# Patient Record
Sex: Male | Born: 1955 | Race: White | Hispanic: No | Marital: Single | Smoking: Never smoker
Health system: Southern US, Community
[De-identification: ages and names within clinical notes are randomized; demographics above are authoritative.]

## PROBLEM LIST (undated history)

## (undated) DIAGNOSIS — I10 Essential (primary) hypertension: Secondary | ICD-10-CM

## (undated) HISTORY — PX: ELBOW SURGERY: SHX618

---

## 2016-06-09 DIAGNOSIS — I10 Essential (primary) hypertension: Secondary | ICD-10-CM | POA: Diagnosis not present

## 2016-06-09 DIAGNOSIS — M79672 Pain in left foot: Secondary | ICD-10-CM | POA: Diagnosis not present

## 2017-02-13 DIAGNOSIS — E7800 Pure hypercholesterolemia, unspecified: Secondary | ICD-10-CM | POA: Diagnosis not present

## 2017-02-13 DIAGNOSIS — I10 Essential (primary) hypertension: Secondary | ICD-10-CM | POA: Diagnosis not present

## 2017-11-02 DIAGNOSIS — I10 Essential (primary) hypertension: Secondary | ICD-10-CM | POA: Diagnosis not present

## 2017-11-02 DIAGNOSIS — R7301 Impaired fasting glucose: Secondary | ICD-10-CM | POA: Diagnosis not present

## 2017-11-02 DIAGNOSIS — E7800 Pure hypercholesterolemia, unspecified: Secondary | ICD-10-CM | POA: Diagnosis not present

## 2017-11-02 DIAGNOSIS — J309 Allergic rhinitis, unspecified: Secondary | ICD-10-CM | POA: Diagnosis not present

## 2018-05-14 DIAGNOSIS — R7303 Prediabetes: Secondary | ICD-10-CM | POA: Diagnosis not present

## 2018-05-14 DIAGNOSIS — E7800 Pure hypercholesterolemia, unspecified: Secondary | ICD-10-CM | POA: Diagnosis not present

## 2018-05-14 DIAGNOSIS — I10 Essential (primary) hypertension: Secondary | ICD-10-CM | POA: Diagnosis not present

## 2018-07-19 DIAGNOSIS — R1900 Intra-abdominal and pelvic swelling, mass and lump, unspecified site: Secondary | ICD-10-CM | POA: Diagnosis not present

## 2018-07-19 DIAGNOSIS — M79671 Pain in right foot: Secondary | ICD-10-CM | POA: Diagnosis not present

## 2018-07-19 DIAGNOSIS — L723 Sebaceous cyst: Secondary | ICD-10-CM | POA: Diagnosis not present

## 2018-07-25 ENCOUNTER — Other Ambulatory Visit: Payer: Self-pay | Admitting: Family Medicine

## 2018-07-25 DIAGNOSIS — R1904 Left lower quadrant abdominal swelling, mass and lump: Secondary | ICD-10-CM

## 2018-07-31 ENCOUNTER — Other Ambulatory Visit: Payer: Self-pay | Admitting: Family Medicine

## 2018-07-31 DIAGNOSIS — R1900 Intra-abdominal and pelvic swelling, mass and lump, unspecified site: Secondary | ICD-10-CM

## 2018-08-01 ENCOUNTER — Other Ambulatory Visit: Payer: Self-pay

## 2018-08-01 ENCOUNTER — Ambulatory Visit
Admission: RE | Admit: 2018-08-01 | Discharge: 2018-08-01 | Disposition: A | Discharge status: Home / Self Care | Payer: 59 | Source: Ambulatory Visit | Attending: Family Medicine | Admitting: Family Medicine

## 2018-08-01 DIAGNOSIS — R1900 Intra-abdominal and pelvic swelling, mass and lump, unspecified site: Secondary | ICD-10-CM

## 2018-08-06 ENCOUNTER — Other Ambulatory Visit: Payer: Self-pay | Admitting: Family Medicine

## 2018-08-06 DIAGNOSIS — R1905 Periumbilic swelling, mass or lump: Secondary | ICD-10-CM

## 2018-08-30 ENCOUNTER — Ambulatory Visit
Admission: RE | Admit: 2018-08-30 | Discharge: 2018-08-30 | Disposition: A | Discharge status: Home / Self Care | Payer: 59 | Source: Ambulatory Visit | Attending: Family Medicine | Admitting: Family Medicine

## 2018-08-30 ENCOUNTER — Other Ambulatory Visit: Payer: Self-pay

## 2018-08-30 DIAGNOSIS — R1905 Periumbilic swelling, mass or lump: Secondary | ICD-10-CM | POA: Diagnosis not present

## 2019-06-23 ENCOUNTER — Encounter: Payer: Self-pay | Admitting: Surgery

## 2019-06-23 ENCOUNTER — Ambulatory Visit: Payer: Self-pay | Admitting: Surgery

## 2019-06-23 DIAGNOSIS — E66813 Obesity, class 3: Secondary | ICD-10-CM | POA: Insufficient documentation

## 2019-06-23 DIAGNOSIS — D128 Benign neoplasm of rectum: Secondary | ICD-10-CM | POA: Insufficient documentation

## 2019-06-23 DIAGNOSIS — K420 Umbilical hernia with obstruction, without gangrene: Secondary | ICD-10-CM | POA: Insufficient documentation

## 2019-06-23 DIAGNOSIS — I10 Essential (primary) hypertension: Secondary | ICD-10-CM

## 2019-06-23 DIAGNOSIS — R221 Localized swelling, mass and lump, neck: Secondary | ICD-10-CM
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
## 2019-07-17 ENCOUNTER — Other Ambulatory Visit: Payer: Self-pay

## 2019-07-17 ENCOUNTER — Encounter (HOSPITAL_COMMUNITY): Payer: Self-pay

## 2019-07-17 ENCOUNTER — Encounter (HOSPITAL_COMMUNITY)
Admission: RE | Admit: 2019-07-17 | Discharge: 2019-07-17 | Disposition: A | Discharge status: Home / Self Care | Payer: 59 | Source: Ambulatory Visit | Attending: Surgery | Admitting: Surgery

## 2019-07-17 DIAGNOSIS — Z01818 Encounter for other preprocedural examination: Secondary | ICD-10-CM | POA: Insufficient documentation

## 2019-07-17 HISTORY — DX: Essential (primary) hypertension: I10

## 2019-07-17 LAB — CBC
HCT: 45.1 % (ref 39.0–52.0)
Hemoglobin: 14.7 g/dL (ref 13.0–17.0)
MCH: 30.3 pg (ref 26.0–34.0)
MCHC: 32.6 g/dL (ref 30.0–36.0)
MCV: 93 fL (ref 80.0–100.0)
Platelets: 295 10*3/uL (ref 150–400)
RBC: 4.85 MIL/uL (ref 4.22–5.81)
RDW: 13 % (ref 11.5–15.5)
WBC: 8.6 10*3/uL (ref 4.0–10.5)
nRBC: 0 % (ref 0.0–0.2)

## 2019-07-17 LAB — BASIC METABOLIC PANEL
Anion gap: 12 (ref 5–15)
BUN: 22 mg/dL (ref 8–23)
CO2: 23 mmol/L (ref 22–32)
Calcium: 8.9 mg/dL (ref 8.9–10.3)
Chloride: 103 mmol/L (ref 98–111)
Creatinine, Ser: 1.01 mg/dL (ref 0.61–1.24)
GFR calc Af Amer: >60 mL/min (ref >60)
GFR calc non Af Amer: >60 mL/min (ref >60)
Glucose, Bld: 101 mg/dL — ABNORMAL HIGH (ref 70–99)
Potassium: 3.8 mmol/L (ref 3.5–5.1)
Sodium: 138 mmol/L (ref 135–145)

## 2019-07-17 LAB — HEMOGLOBIN A1C
Hgb A1c MFr Bld: 6 % — ABNORMAL HIGH (ref 4.8–5.6)
Mean Plasma Glucose: 125.5 mg/dL

## 2019-07-17 NOTE — Patient Instructions (Addendum)
DUE TO COVID-19 ONLY ONE VISITOR IS ALLOWED TO COME WITH YOU AND [name] IN THE WAITING ROOM ONLY DURING PRE OP AND PROCEDURE DAY OF SURGERY. THE 1 VISITOR MAY VISIT WITH YOU AFTER SURGERY IN YOUR PRIVATE ROOM DURING VISITING HOURS ONLY!  YOU NEED TO HAVE A COVID 19 TEST ON_Monday 03/08/2021______ @__9 :30 am_____, THIS TEST MUST BE DONE BEFORE SURGERY, COME  [name] , [id number].  ([place]) ONCE YOUR COVID TEST IS COMPLETED, PLEASE BEGIN THE QUARANTINE INSTRUCTIONS AS OUTLINED IN YOUR HANDOUT.                [name]     Your procedure is scheduled on: Thursday 07/24/2019   Report to [facility]  [name]    Report to admitting at 0800 AM     Call this number Mark [name] you have problems the morning of surgery [phone]               Please follow bowel prep instructions from Dr. Johney [place] the day before surgery on Wednesday 07/23/2019 and follow a clear liquid diet!    DRINK 2 PRESURGERY ENSURE DRINKS THE NIGHT BEFORE SURGERY AT1000 PM AND 1 PRESURGERY DRINK THE DAY OF THE PROCEDURE 3 HOURS PRIOR TO SCHEDULED SURGERY.    NO SOLIDS AFTER MIDNIGHT THE DAY PRIOR TO THE SURGERY.    NOTHING BY MOUTH EXCEPT CLEAR LIQUIDS UNTIL THREE HOURS PRIOR TO SCHEDULED SURGERY.    PLEASE FINISH PRESURGERY ENSURE DRINK PER SURGEON ORDER 3 HOURS PRIOR TO SCHEDULED SURGERY TIME WHICH NEEDS TO BE COMPLETED AT 0700 am!    CLEAR LIQUID DIET   Foods Allowed                                                                     Foods Excluded  Coffee and tea, regular and decaf                             liquids that you cannot  Plain Jell-O any favor except red or purple                                           see through such as: Fruit ices (not with fruit pulp)                                     milk, soups, orange juice  Iced Popsicles                                    All solid food Carbonated beverages, regular and diet                                     Cranberry, grape and apple juices Sports drinks like Gatorade Lightly seasoned clear broth or consume(fat free) Sugar, honey syrup  Sample Menu  Breakfast                                Lunch                                     Supper Cranberry juice                    Beef broth                            Chicken broth Jell-O                                     Grape juice                           Apple juice Coffee or tea                        Jell-O                                      Popsicle                                                Coffee or tea                        Coffee or tea  _____________________________________________________________________           BRUSH YOUR TEETH MORNING OF SURGERY AND RINSE YOUR MOUTH OUT, NO CHEWING GUM CANDY OR MINTS.     Take these medicines the morning of surgery with A SIP OF WATER: Doxazosin (Cardura), Metoprolol (Toprol-XL)                                 You may not have any metal on your body including hair pins and              piercings  Do not wear jewelry, make-up, lotions, powders or perfumes, deodorant                          Men may shave face and neck.   Do not bring valuables to the hospital. [place].  Contacts, dentures or bridgework may not be worn into surgery.  Leave suitcase in the car. After surgery it may be brought to your room.     Patients discharged the day of surgery will not be allowed to drive home. Mark [name] YOU ARE HAVING SURGERY AND GOING HOME THE SAME DAY, YOU MUST HAVE AN ADULT TO DRIVE YOU HOME AND  BE WITH YOU FOR 24 HOURS. YOU MAY GO HOME BY TAXI OR UBER OR ORTHERWISE, BUT AN ADULT MUST ACCOMPANY YOU HOME AND [name]  WITH YOU FOR 24 HOURS.  Name and phone number of your driver:daughter-Mark Mark [name]                Please read over the following fact sheets you were given: _____________________________________________________________________              [name] - Preparing for Surgery Before surgery, you can play an important role.  Because skin is not sterile, your skin needs to be as free of germs as possible.  You can reduce the number of germs on your skin by washing with CHG (chlorahexidine gluconate) soap before surgery.  CHG is an antiseptic cleaner which kills germs and bonds with the skin to continue killing germs even after washing. Please DO NOT use Mark [name]  Mark [name] your skin becomes reddened/irritated stop using the CHG and inform your nurse when you arrive at [name]. Do not shave (including legs and underarms) for at least 48 hours prior to the first CHG shower.  You may shave your face/neck. Please follow these instructions carefully:  1.  Shower with CHG Soap the night before surgery and the  morning of Surgery.  2.  Mark [name] you choose to wash your hair, wash your hair first as usual with your  normal  shampoo.  3.  After you shampoo, rinse your hair and body thoroughly to remove the  shampoo.                           4.  Use CHG as you would any other liquid soap.  You can apply chg directly  to the skin and wash                       Gently with a scrungie or clean washcloth.  5.  Apply the CHG Soap to your body ONLY FROM THE NECK DOWN.   Do not use on face/ open                           Wound or open sores. Avoid contact with eyes, ears mouth and genitals (private parts).                       Wash face,  Genitals (private parts) with your normal soap.             6.  Wash thoroughly, paying special attention to the area where your surgery  will be performed.  7.  Thoroughly rinse your body with warm water from the neck down.  8.  DO NOT shower/wash with your normal soap after using and rinsing off  the CHG Soap.                9.  Pat yourself dry with a clean towel.            10.  Wear clean pajamas.            11.  Place clean sheets on your bed the night of your first shower  and do not  sleep with pets. Day of Surgery : Do not apply any lotions/deodorants the morning of surgery.  Please wear clean clothes to the hospital/surgery center.  FAILURE TO FOLLOW THESE INSTRUCTIONS MAY RESULT IN THE CANCELLATION OF YOUR SURGERY PATIENT SIGNATURE_________________________________  NURSE SIGNATURE__________________________________  ________________________________________________________________________   [name]  An  incentive spirometer is a tool that can help keep your lungs clear and active. This tool measures how well you are filling your lungs with each breath. Taking long deep breaths may help reverse or decrease the chance of developing breathing (pulmonary) problems (especially infection) following:  A long period of time when you are unable to move or be active. BEFORE THE PROCEDURE   Mark [name] the spirometer includes an indicator to show your best effort, your nurse or respiratory therapist will set it to a desired goal.  Mark [name] possible, sit up straight or lean slightly forward. Try not to slouch.  Hold the incentive spirometer in an upright position. INSTRUCTIONS FOR USE  1. Sit on the edge of your bed Mark [name] possible, or sit up as far as you can in bed or on a chair. 2. Hold the incentive spirometer in an upright position. 3. Breathe out normally. 4. Place the mouthpiece in your mouth and seal your lips tightly around it. 5. Breathe in slowly and as deeply as possible, raising the piston or the ball toward the top of the column. 6. Hold your breath for 3-5 seconds or for as long as possible. Allow the piston or ball to fall to the bottom of the column. 7. Remove the mouthpiece from your mouth and breathe out normally. 8. Rest for a few seconds and repeat Steps 1 through 7 at least 10 times every 1-2 hours when you are awake. Take your time and take a few normal breaths between deep breaths. 9. The spirometer may include an indicator to show your best  effort. Use the indicator as a goal to work toward during each repetition. 10. After each set of 10 deep breaths, practice coughing to be sure your lungs are clear. Mark [name] you have an incision (the cut made at the time of surgery), support your incision when coughing by placing a pillow or rolled up towels firmly against it. Once you are able to get out of bed, walk around indoors and cough well. You may stop using the incentive spirometer when instructed by your caregiver.  RISKS AND COMPLICATIONS  Take your time so you do not get dizzy or light-headed.  Mark [name] you are in pain, you may need to take or ask for pain medication before doing incentive spirometry. It is harder to take a deep breath Mark [name] you are having pain. AFTER USE  Rest and breathe slowly and easily.  It can be helpful to keep track of a log of your progress. Your caregiver can provide you with a simple table to help with this. Mark [name] you are using the spirometer at home, follow these instructions: Mark Mark [name]:   You are having difficultly using the spirometer.  You have trouble using the spirometer as often as instructed.  Your pain medication is not giving enough relief while using the spirometer.  You develop fever of 100.5 F (38.1 C) or higher. SEEK IMMEDIATE MEDICAL CARE Mark [name]:   You cough up bloody sputum that had not been present before.  You develop fever of 102 F (38.9 C) or greater.  You develop worsening pain at or near the incision site. MAKE SURE YOU:   Understand these instructions.  Will watch your condition.  Will get help right away Mark [name] you are not doing well or get worse. Document Released: 09/11/2006 Document Revised: 07/24/2011 Document Reviewed: 11/12/2006 [facility] Patient Information 2014 [place], [place].   ________________________________________________________________________

## 2019-07-17 NOTE — Progress Notes (Signed)
PCP - Dr. Donald Prose Cardiologist - n/a  Chest x-ray - n/a EKG - 07/17/2019  epic Stress Test - n/a ECHO - n/a Cardiac Cath - n/a  Sleep Study - n/a CPAP - n/a  Fasting Blood Sugar -n/a Checks Blood Sugar _0____ times a day  Blood Thinner Instructions:n/a Aspirin Instructions:takes Aspirin EC 81 mg per Dr. Nancy Fetter. Instructed to call Dr. Nancy Fetter and get instructions for stopping Aspirin prior to surgery. Patient verbalized understanding. Last Dose:07/17/2019  Anesthesia review:   Patient has a history of HTN.  Patient denies shortness of breath, fever, cough and chest pain at PAT appointment   Patient verbalized understanding of instructions that were given to them at the PAT appointment. Patient was also instructed that they will need to review over the PAT instructions again at home before surgery.

## 2019-07-21 ENCOUNTER — Other Ambulatory Visit (HOSPITAL_COMMUNITY)
Admission: RE | Admit: 2019-07-21 | Discharge: 2019-07-21 | Disposition: A | Discharge status: Home / Self Care | Payer: 59 | Source: Ambulatory Visit | Attending: Surgery | Admitting: Surgery

## 2019-07-21 DIAGNOSIS — Z20822 Contact with and (suspected) exposure to covid-19: Secondary | ICD-10-CM | POA: Diagnosis not present

## 2019-07-21 DIAGNOSIS — Z01812 Encounter for preprocedural laboratory examination: Secondary | ICD-10-CM | POA: Diagnosis not present

## 2019-07-21 LAB — SARS CORONAVIRUS 2 (TAT 6-24 HRS): SARS Coronavirus 2: NEGATIVE

## 2019-07-23 MED ORDER — SODIUM CHLORIDE 0.9 % IV SOLN
INTRAVENOUS | Status: DC
  Filled 2019-07-23: qty 6

## 2019-07-23 MED ORDER — BUPIVACAINE LIPOSOME 1.3 % IJ SUSP
20.0000 mL | Freq: Once | INTRAMUSCULAR | Status: DC
  Filled 2019-07-23: qty 20

## 2019-07-24 ENCOUNTER — Ambulatory Visit (HOSPITAL_COMMUNITY): Payer: 59 | Admitting: Registered Nurse

## 2019-07-24 ENCOUNTER — Encounter (HOSPITAL_COMMUNITY): Payer: Self-pay | Admitting: Surgery

## 2019-07-24 ENCOUNTER — Encounter (HOSPITAL_COMMUNITY)
Admission: RE | Disposition: A | Discharge status: Home / Self Care | Payer: Self-pay | Source: Home / Self Care | Attending: Surgery

## 2019-07-24 ENCOUNTER — Ambulatory Visit (HOSPITAL_COMMUNITY)
Admission: RE | Admit: 2019-07-24 | Discharge: 2019-07-24 | Disposition: A | Discharge status: Home / Self Care | Payer: 59 | Attending: Surgery | Admitting: Surgery

## 2019-07-24 DIAGNOSIS — E66813 Obesity, class 3: Secondary | ICD-10-CM | POA: Diagnosis present

## 2019-07-24 DIAGNOSIS — I10 Essential (primary) hypertension: Secondary | ICD-10-CM | POA: Diagnosis not present

## 2019-07-24 DIAGNOSIS — Z79899 Other long term (current) drug therapy: Secondary | ICD-10-CM | POA: Diagnosis not present

## 2019-07-24 DIAGNOSIS — Z6841 Body Mass Index (BMI) 40.0 and over, adult: Secondary | ICD-10-CM | POA: Insufficient documentation

## 2019-07-24 DIAGNOSIS — D128 Benign neoplasm of rectum: Secondary | ICD-10-CM | POA: Diagnosis not present

## 2019-07-24 DIAGNOSIS — Z7982 Long term (current) use of aspirin: Secondary | ICD-10-CM | POA: Diagnosis not present

## 2019-07-24 HISTORY — PX: PARTIAL PROCTECTOMY BY TEM: SHX6011

## 2019-07-24 SURGERY — PARTIAL PROCTECTOMY BY TEM
Anesthesia: General

## 2019-07-24 MED ORDER — POLYETHYLENE GLYCOL 3350 17 GM/SCOOP PO POWD: 1.0000 | Freq: Once | ORAL | Status: DC

## 2019-07-24 MED ORDER — PROPOFOL 10 MG/ML IV BOLUS
INTRAVENOUS | Status: DC | PRN
  Administered 2019-07-24: 20 mg via INTRAVENOUS
  Administered 2019-07-24: 200 mg via INTRAVENOUS

## 2019-07-24 MED ORDER — SODIUM CHLORIDE (PF) 0.9 % IJ SOLN
INTRAMUSCULAR | Status: AC
  Filled 2019-07-24: qty 20

## 2019-07-24 MED ORDER — GABAPENTIN 300 MG PO CAPS
300.0000 mg | ORAL_CAPSULE | ORAL | Status: AC
  Administered 2019-07-24: 300 mg via ORAL
  Filled 2019-07-24: qty 1

## 2019-07-24 MED ORDER — ONDANSETRON HCL 4 MG/2ML IJ SOLN
INTRAMUSCULAR | Status: AC
  Filled 2019-07-24: qty 2

## 2019-07-24 MED ORDER — DEXAMETHASONE SODIUM PHOSPHATE 10 MG/ML IJ SOLN
INTRAMUSCULAR | Status: DC | PRN
  Administered 2019-07-24: 8 mg via INTRAVENOUS

## 2019-07-24 MED ORDER — ONDANSETRON HCL 4 MG/2ML IJ SOLN
INTRAMUSCULAR | Status: DC | PRN
  Administered 2019-07-24: 4 mg via INTRAVENOUS

## 2019-07-24 MED ORDER — MIDAZOLAM HCL 5 MG/5ML IJ SOLN
INTRAMUSCULAR | Status: DC | PRN
  Administered 2019-07-24: 2 mg via INTRAVENOUS

## 2019-07-24 MED ORDER — ALVIMOPAN 12 MG PO CAPS
12.0000 mg | ORAL_CAPSULE | ORAL | Status: AC
  Administered 2019-07-24: 12 mg via ORAL
  Filled 2019-07-24: qty 1

## 2019-07-24 MED ORDER — FENTANYL CITRATE (PF) 100 MCG/2ML IJ SOLN
INTRAMUSCULAR | Status: AC
  Filled 2019-07-24: qty 2

## 2019-07-24 MED ORDER — CELECOXIB 200 MG PO CAPS
200.0000 mg | ORAL_CAPSULE | ORAL | Status: AC
  Administered 2019-07-24: 200 mg via ORAL
  Filled 2019-07-24: qty 1

## 2019-07-24 MED ORDER — DIBUCAINE (PERIANAL) 1 % EX OINT
TOPICAL_OINTMENT | CUTANEOUS | Status: AC
  Filled 2019-07-24: qty 28

## 2019-07-24 MED ORDER — LIDOCAINE 2% (20 MG/ML) 5 ML SYRINGE
INTRAMUSCULAR | Status: AC
  Filled 2019-07-24: qty 5

## 2019-07-24 MED ORDER — LIDOCAINE 2% (20 MG/ML) 5 ML SYRINGE
INTRAMUSCULAR | Status: AC
  Filled 2019-07-24: qty 15

## 2019-07-24 MED ORDER — OXYCODONE HCL 5 MG/5ML PO SOLN: 5.0000 mg | Freq: Once | ORAL | Status: DC | PRN

## 2019-07-24 MED ORDER — OXYCODONE HCL 5 MG PO TABS: 5.0000 mg | ORAL_TABLET | Freq: Once | ORAL | Status: DC | PRN

## 2019-07-24 MED ORDER — ROCURONIUM BROMIDE 10 MG/ML (PF) SYRINGE
PREFILLED_SYRINGE | INTRAVENOUS | Status: AC
  Filled 2019-07-24: qty 10

## 2019-07-24 MED ORDER — BUPIVACAINE-EPINEPHRINE 0.25% -1:200000 IJ SOLN
INTRAMUSCULAR | Status: DC | PRN
  Administered 2019-07-24: 20 mL

## 2019-07-24 MED ORDER — DIBUCAINE (PERIANAL) 1 % EX OINT
TOPICAL_OINTMENT | CUTANEOUS | Status: DC | PRN
  Administered 2019-07-24: 1 via RECTAL

## 2019-07-24 MED ORDER — MIDAZOLAM HCL 2 MG/2ML IJ SOLN
INTRAMUSCULAR | Status: AC
  Filled 2019-07-24: qty 2

## 2019-07-24 MED ORDER — OXYCODONE HCL 5 MG PO TABS: 5.0000 mg | ORAL_TABLET | Freq: Four times a day (QID) | ORAL | 0 refills | Status: AC | PRN

## 2019-07-24 MED ORDER — EPHEDRINE 5 MG/ML INJ
INTRAVENOUS | Status: AC
  Filled 2019-07-24: qty 10

## 2019-07-24 MED ORDER — LIDOCAINE 2% (20 MG/ML) 5 ML SYRINGE
INTRAMUSCULAR | Status: DC | PRN
  Administered 2019-07-24: 1.5 mg/kg/h via INTRAVENOUS
  Administered 2019-07-24: 80 mg via INTRAVENOUS

## 2019-07-24 MED ORDER — PROPOFOL 10 MG/ML IV BOLUS
INTRAVENOUS | Status: AC
  Filled 2019-07-24: qty 20

## 2019-07-24 MED ORDER — BUPIVACAINE-EPINEPHRINE (PF) 0.25% -1:200000 IJ SOLN
INTRAMUSCULAR | Status: AC
  Filled 2019-07-24: qty 60

## 2019-07-24 MED ORDER — CHLORHEXIDINE GLUCONATE CLOTH 2 % EX PADS: 6.0000 | MEDICATED_PAD | Freq: Once | CUTANEOUS | Status: DC

## 2019-07-24 MED ORDER — DEXAMETHASONE SODIUM PHOSPHATE 10 MG/ML IJ SOLN
INTRAMUSCULAR | Status: AC
  Filled 2019-07-24: qty 1

## 2019-07-24 MED ORDER — NEOMYCIN SULFATE 500 MG PO TABS: 1000.0000 mg | ORAL_TABLET | ORAL | Status: DC

## 2019-07-24 MED ORDER — ENOXAPARIN SODIUM 40 MG/0.4ML ~~LOC~~ SOLN
40.0000 mg | Freq: Once | SUBCUTANEOUS | Status: AC
  Administered 2019-07-24: 40 mg via SUBCUTANEOUS
  Filled 2019-07-24: qty 0.4

## 2019-07-24 MED ORDER — SUGAMMADEX SODIUM 500 MG/5ML IV SOLN
INTRAVENOUS | Status: AC
  Filled 2019-07-24: qty 5

## 2019-07-24 MED ORDER — METRONIDAZOLE 500 MG PO TABS: 1000.0000 mg | ORAL_TABLET | ORAL | Status: DC

## 2019-07-24 MED ORDER — LACTATED RINGERS IV SOLN: INTRAVENOUS | Status: DC

## 2019-07-24 MED ORDER — EPHEDRINE SULFATE-NACL 50-0.9 MG/10ML-% IV SOSY
PREFILLED_SYRINGE | INTRAVENOUS | Status: DC | PRN
  Administered 2019-07-24: 5 mg via INTRAVENOUS
  Administered 2019-07-24: 10 mg via INTRAVENOUS
  Administered 2019-07-24 (×2): 5 mg via INTRAVENOUS

## 2019-07-24 MED ORDER — ACETAMINOPHEN 500 MG PO TABS
1000.0000 mg | ORAL_TABLET | ORAL | Status: AC
  Administered 2019-07-24: 1000 mg via ORAL
  Filled 2019-07-24: qty 2

## 2019-07-24 MED ORDER — SUGAMMADEX SODIUM 500 MG/5ML IV SOLN
INTRAVENOUS | Status: DC | PRN
  Administered 2019-07-24: 250 mg via INTRAVENOUS

## 2019-07-24 MED ORDER — SODIUM CHLORIDE 0.9 % IV SOLN
2.0000 g | INTRAVENOUS | Status: AC
  Administered 2019-07-24: 2 g via INTRAVENOUS
  Filled 2019-07-24: qty 2

## 2019-07-24 MED ORDER — ONDANSETRON HCL 4 MG/2ML IJ SOLN: 4.0000 mg | Freq: Once | INTRAMUSCULAR | Status: DC | PRN

## 2019-07-24 MED ORDER — BISACODYL 5 MG PO TBEC: 20.0000 mg | DELAYED_RELEASE_TABLET | Freq: Once | ORAL | Status: DC

## 2019-07-24 MED ORDER — ROCURONIUM BROMIDE 10 MG/ML (PF) SYRINGE
PREFILLED_SYRINGE | INTRAVENOUS | Status: DC | PRN
  Administered 2019-07-24: 70 mg via INTRAVENOUS

## 2019-07-24 MED ORDER — FENTANYL CITRATE (PF) 100 MCG/2ML IJ SOLN: 25.0000 ug | INTRAMUSCULAR | Status: DC | PRN

## 2019-07-24 MED ORDER — BUPIVACAINE LIPOSOME 1.3 % IJ SUSP
INTRAMUSCULAR | Status: DC | PRN
  Administered 2019-07-24: 20 mL

## 2019-07-24 MED ORDER — FENTANYL CITRATE (PF) 100 MCG/2ML IJ SOLN
INTRAMUSCULAR | Status: DC | PRN
  Administered 2019-07-24 (×2): 50 ug via INTRAVENOUS

## 2019-07-24 MED ORDER — 0.9 % SODIUM CHLORIDE (POUR BTL) OPTIME
TOPICAL | Status: DC | PRN
  Administered 2019-07-24: 1000 mL

## 2019-07-24 SURGICAL SUPPLY — 51 items
BLADE SURG 15 STRL LF DISP TIS (BLADE) IMPLANT
BLADE SURG 15 STRL SS (BLADE)
BRIEF STRETCH FOR OB PAD LRG (UNDERPADS AND DIAPERS) IMPLANT
CABLE HIGH FREQUENCY MONO STRZ (ELECTRODE) ×2 IMPLANT
COVER WAND RF STERILE (DRAPES) IMPLANT
DRAPE LAPAROTOMY T 102X78X121 (DRAPES) IMPLANT
DRAPE LAPAROTOMY T 98X78 PEDS (DRAPES) ×2 IMPLANT
DRAPE SURG IRRIG POUCH 19X23 (DRAPES) IMPLANT
DRAPE WARM FLUID 44X44 (DRAPES) ×2 IMPLANT
DRSG PAD ABDOMINAL 8X10 ST (GAUZE/BANDAGES/DRESSINGS) ×2 IMPLANT
ELECT REM PT RETURN 15FT ADLT (MISCELLANEOUS) ×2 IMPLANT
GAUZE 4X4 16PLY RFD (DISPOSABLE) ×2 IMPLANT
GAUZE SPONGE 4X4 12PLY STRL (GAUZE/BANDAGES/DRESSINGS) ×2 IMPLANT
GLOVE ECLIPSE 8.0 STRL XLNG CF (GLOVE) ×4 IMPLANT
GLOVE INDICATOR 8.0 STRL GRN (GLOVE) ×4 IMPLANT
GOWN STRL REUS W/TWL XL LVL3 (GOWN DISPOSABLE) ×6 IMPLANT
KIT BASIN OR (CUSTOM PROCEDURE TRAY) ×2 IMPLANT
KIT TURNOVER KIT A (KITS) IMPLANT
LEGGING LITHOTOMY PAIR STRL (DRAPES) IMPLANT
NEEDLE HYPO 22GX1.5 SAFETY (NEEDLE) ×2 IMPLANT
PACK BASIC VI WITH GOWN DISP (CUSTOM PROCEDURE TRAY) ×2 IMPLANT
PAD POSITIONING PINK XL (MISCELLANEOUS) IMPLANT
PENCIL SMOKE EVACUATOR (MISCELLANEOUS) ×2 IMPLANT
RETRACTOR LONE STAR DISPOSABLE (INSTRUMENTS) IMPLANT
RETRACTOR STAY HOOK 5MM (MISCELLANEOUS) IMPLANT
SCISSORS LAP 5X35 DISP (ENDOMECHANICALS) ×2 IMPLANT
SET IRRIG TUBING LAPAROSCOPIC (IRRIGATION / IRRIGATOR) ×2 IMPLANT
SET TUBE SMOKE EVAC HIGH FLOW (TUBING) ×2 IMPLANT
SHEARS HARMONIC ACE PLUS 36CM (ENDOMECHANICALS) ×2 IMPLANT
STOPCOCK 4 WAY LG BORE MALE ST (IV SETS) ×2 IMPLANT
SURGILUBE 2OZ TUBE FLIPTOP (MISCELLANEOUS) ×2 IMPLANT
SUT CHROMIC 3 0 SH 27 (SUTURE) IMPLANT
SUT PDS AB 2-0 CT2 27 (SUTURE) IMPLANT
SUT PDS AB 3-0 SH 27 (SUTURE) IMPLANT
SUT SILK 2 0 (SUTURE)
SUT SILK 2 0 SH CR/8 (SUTURE) IMPLANT
SUT SILK 2-0 18XBRD TIE 12 (SUTURE) IMPLANT
SUT SILK 3 0 SH 30 (SUTURE) IMPLANT
SUT SILK 3 0 SH CR/8 (SUTURE) IMPLANT
SUT V-LOC BARB 180 2/0GR6 GS22 (SUTURE)
SUT VIC AB 2-0 UR6 27 (SUTURE) ×4 IMPLANT
SUT VIC AB 3-0 SH 27 (SUTURE)
SUT VIC AB 3-0 SH 27XBRD (SUTURE) IMPLANT
SUTURE V-LC BRB 180 2/0GR6GS22 (SUTURE) IMPLANT
SYR 20ML LL LF (SYRINGE) ×2 IMPLANT
SYR BULB IRRIGATION 50ML (SYRINGE) IMPLANT
TOWEL OR 17X26 10 PK STRL BLUE (TOWEL DISPOSABLE) ×2 IMPLANT
TOWEL OR NON WOVEN STRL DISP B (DISPOSABLE) ×2 IMPLANT
TRAY FOLEY MTR SLVR 16FR STAT (SET/KITS/TRAYS/PACK) ×2 IMPLANT
TUBING CONNECTING 10 (TUBING) IMPLANT
YANKAUER SUCT BULB TIP 10FT TU (MISCELLANEOUS) IMPLANT

## 2019-07-24 NOTE — Discharge Instructions (Signed)
ANORECTAL SURGERY:  POST OPERATIVE INSTRUCTIONS  ######################################################################  EAT Start with a pureed / full liquid diet After 24 hours, gradually transition to a high fiber diet.    CONTROL PAIN Control pain so you can tolerate bowel movements,  walk, sleep, tolerate sneezing/coughing, and go up/down stairs.   HAVE A BOWEL MOVEMENT DAILY Keep your bowels regular to avoid problems.   Taking a fiber supplement every day to keep bowels soft.   Try a laxative to override constipation. Use an antidairrheal to slow down diarrhea.   Call if not better after 2 tries  WALK Walk an hour a day.  Control your pain to do that.   CALL IF YOU HAVE PROBLEMS/CONCERNS Call if you are still struggling despite following these instructions. Call if you have concerns not answered by these instructions  ######################################################################    1. Take your usually prescribed home medications unless otherwise directed.  2. DIET: Follow a light bland diet & liquids the first 24 hours after arrival home, such as soup, liquids, starches, etc.  Be sure to drink plenty of fluids.  Quickly advance to a usual solid diet within a few days.  Avoid fast food or heavy meals as your are more likely to get nauseated or have irregular bowels.  A low-fat, high-fiber diet for the rest of your life is ideal.  3. PAIN CONTROL: a. Pain is best controlled by a usual combination of three different methods TOGETHER: i. Ice/Heat ii. Over the counter pain medication iii. Prescription pain medication b. Expect swelling and discomfort in the anus/rectal area.  Warm water baths (30-60 minutes up to 6 times a day, especially after bowel meovements) will help. Use ice for the first few days to help decrease swelling and bruising, then switch to heat such as warm towels, sitz baths, warm baths, etc to help relax tight/sore spots and speed recovery.   Some people prefer to use ice alone, heat alone, alternating between ice & heat.  Experiment to what works for you.   c. It is helpful to take an over-the-counter pain medication continuously for the first few weeks.  Choose one of the following that works best for you: i. Naproxen (Aleve, etc)  Two 250m tabs twice a day ii. Ibuprofen (Advil, etc) Three 2072mtabs four times a day (every meal & bedtime) iii. Acetaminophen (Tylenol, etc) 500-65044mour times a day (every meal & bedtime) d. A  prescription for pain medication (such as oxycodone, hydrocodone, etc) should be given to you upon discharge.  Take your pain medication as prescribed.  i. If you are having problems/concerns with the prescription medicine (does not control pain, nausea, vomiting, rash, itching, etc), please call us [phone] see if we need to switch you to a different pain medicine that will work better for you and/or control your side effect better. ii. If you need a refill on your pain medication, please contact your pharmacy.  They will contact our office to request authorization. Prescriptions will not be filled after 5 pm or on week-ends.  If can take up to 48 hours for it to be filled & ready so avoid waiting until you are down to thel ast pill. e. A topical cream (Dibucaine) or a prescription for a cream (such as diltiazem 2% gel) may be given to you.  Many people find relief with topical creams.  Some people find it burns too much.  Experiment.  If it helps, use it.  If it burns, don't using  it.  Use a Sitz Bath 4-8 times a day for relief   [employer] A sitz bath is a warm water bath taken in the sitting position that covers only the hips and buttocks. It may be used for either healing or hygiene purposes. Sitz baths are also used to relieve pain, itching, or muscle spasms. The water may contain medicine. Moist heat will help you heal and relax.  HOME CARE INSTRUCTIONS  Take 3 to 4 sitz baths a day. 1. Fill the  bathtub half full with warm water. 2. Sit in the water and open the drain a little. 3. Turn on the warm water to keep the tub half full. Keep the water running constantly. 4. Soak in the water for 15 to 20 minutes. 5. After the sitz bath, pat the affected area dry first.   4. KEEP YOUR BOWELS REGULAR a. The goal is one soft bowel movement a day b. Avoid getting constipated.  Between the surgery and the pain medications, it is common to experience some constipation.  Increasing fluid intake and taking a fiber supplement (such as Metamucil, Citrucel, FiberCon, MiraLax, etc) 2-3 times a day regularly will usually help prevent this problem from occurring.  A mild laxative (prune juice, Milk of Magnesia, MiraLax, etc) should be taken according to package directions if there are no bowel movements after 48 hours. c. Watch out for diarrhea.  If you have many loose bowel movements, simplify your diet to bland foods & liquids for a few days.  Stop any stool softeners and decrease your fiber supplement.  Switching to mild anti-diarrheal medications (Kayopectate, Pepto Bismol) can help.  Can try an imodium/loperamide dose.  If this worsens or does not improve, please call us.  5. Wound Care  a. Remove your bandages with your first bowel movement, usually the day after surgery.  You may have packing if you had an abscess.  Let any packing or gauze fall come out.   b. Wear an absorbent pad or soft cotton balls in your underwear as needed to catch any drainage and help keep the area  c. Keep the area clean and dry.  Bathe / shower every day.  Keep the area clean by showering / bathing over the incision / wound.   It is okay to soak an open wound to help wash it.  Consider using a squeeze bottle filled with warm water to gently wash the anal area.  Wet wipes or showers / gentle washing after bowel movements is often less traumatic than regular toilet paper. d. Dennis Bast will often notice bleeding with bowel movements.   This should slow down by the end of the first week of surgery.  Sitting on an ice pack can help. e. Expect some drainage.  This should slow down by the end of the first week of surgery, but you will have occasional bleeding or drainage up to a few months after surgery.  Wear an absorbent pad or soft cotton gauze in your underwear until the drainage stops.  6. ACTIVITIES as tolerated:   a. You may resume regular (light) daily activities beginning the next day--such as daily self-care, walking, climbing stairs--gradually increasing activities as tolerated.  If you can walk 30 minutes without difficulty, it is safe to try more intense activity such as jogging, treadmill, bicycling, low-impact aerobics, swimming, etc. b. Save the most intensive and strenuous activity for last such as sit-ups, heavy lifting, contact sports, etc  Refrain from any heavy lifting or straining  until you are off narcotics for pain control.   c. DO NOT PUSH THROUGH PAIN.  Let pain be your guide: If it hurts to do something, don't do it.  Pain is your body warning you to avoid that activity for another week until the pain goes down. d. You may drive when you are no longer taking prescription pain medication, you can comfortably sit for long periods of time, and you can safely maneuver your car and apply brakes. e. Dennis Bast may have sexual intercourse when it is comfortable.  7. FOLLOW UP in our office a. Please call [facility] at [phone] to set up an appointment to see your surgeon in the office for a follow-up appointment approximately 2-3 weeks after your surgery. b. Make sure that you call for this appointment the day you arrive home to ensure a convenient appointment time.  8. IF YOU HAVE DISABILITY OR FAMILY LEAVE FORMS, BRING THEM TO THE OFFICE FOR PROCESSING.  DO NOT GIVE THEM TO YOUR DOCTOR.        WHEN TO CALL us [phone]: 1. Poor pain control 2. Reactions / problems with new medications (rash/itching, nausea,  etc)  3. Fever over 101.5 F (38.5 C) 4. Inability to urinate 5. Nausea and/or vomiting 6. Worsening swelling or bruising 7. Continued bleeding from incision. 8. Increased pain, redness, or drainage from the incision  The clinic staff is available to answer your questions during regular business hours (8:30am-5pm).  Please don't hesitate to call and ask to speak to one of our nurses for clinical concerns.   A surgeon from [facility] is always on call at the hospitals   If you have a medical emergency, go to the nearest emergency room or call 911.    [facility], [place], [place], [place], [place]  [address] ? MAIN: [phone] ? TOLL FREE: [phone] ? FAX [phone] [email or web address]   TRANSANAL ENDOSCOPIC MICROSURGERY (TEM)  Transanal endoscopic microsurgery (TEM) was developed as a means to provide a less invasive way to operate on the rectum.  This may needed to provide a good resection of part of the rectal wall to remove a large pre-cancerous polyp or an early cancer in which the patient cannot tolerate an open surgery or has an extremely hostile abdomen that makes the resection very risky.    The rectum is the last foot of the tubular digestive tract.  It resides in the pelvis, laying on top of your tailbone.  It is the final reservoir for stool before it is evacuated through the anus in the process of defecation, naturally stretching and contracting to allow time for someone to control bowel function.  The rectum is a common location for polyps or even a cancer.    In most cases when a polyp is found in the colon or rectum, a person often does not need to have a large resection of the rectum, but have it excised by a colonoscope.  However, some polyps are too large to be safely removed through endoscopy and require surgery.  Classically, this is done through an open incision where a low anterior resection or  abdominoperineal resection where part or the entire rectum is removed.  Often, only part of a wall of the rectum needs to be removed.   Transanal endoscopic microsurgery (TEM) was developed as a means to provide a less invasive way to operate on the rectum.    Transanal endoscopic microsurgery (TEM) involves the  patient to be placed under complete general anesthesia.  The anus is gently dilated and a metal tube is placed into the rectum.  Through the tube, absorbable gas is infused and long instruments are used to help access and cut out the polyp or tumor from part of the rectum.  The long instruments are also used to help sew the wound shut.  The specimen is then sent for pathology.  The procedure itself usually takes a few hours of time.  The patient stays at least overnight.  When they can tolerate a regular diet and have adequate pain control they usually leave the hospital in one or two days.    The advantage of TEM is that as opposed to a long hospital stay, patient recovery is much faster.  People  less likely to have bowel or other problems.  Careful pre-operative selection is essential to make sure that the patient is an appropriate candidate for the surgery.  Tumors or cancers that are very large or invasive usually are much more difficult to remove by this technique and are not considered the first option.  Persons who are most appropriate for this surgery are those with large polyps that have not become cancers or early cancers in patients who have high risks with larger surgery.   Sometimes a more aggressive laparoscopic or open follow up resection is needed if a cancer is found to give the best chance at pure.    In general, surgery has a better chance at cure if a cancer is found but may not needed if it is only a precancerous polyp.  Risks to the surgery such as pain, bleeding, abscess, leak, ostomy, and death are inherent but overall the TEM procedure is less stressful and less risky to the  patient than a classic colorectal resection throughthe abdomen.  Your colorectal surgeon can help decide what option is best for you.    Colon Polyps  Polyps are tissue growths inside the body. Polyps can grow in many places, including the large intestine (colon). A polyp may be a round bump or a mushroom-shaped growth. You could have one polyp or several. Most colon polyps are noncancerous (benign). However, some colon polyps can become cancerous over time. Finding and removing the polyps early can help prevent this. What are the causes? The exact cause of colon polyps is not known. What increases the risk? You are more likely to develop this condition if you:  Have a family history of colon cancer or colon polyps.  Are older than 5 or older than 45 if you are African American.  Have inflammatory bowel disease, such as ulcerative colitis or Crohn's disease.  Have certain hereditary conditions, such as: ? Familial adenomatous polyposis. ? Lynch syndrome. ? Turcot syndrome. ? Peutz-Jeghers syndrome.  Are overweight.  Smoke cigarettes.  Do not get enough exercise.  Drink too much alcohol.  Eat a diet that is high in fat and red meat and low in fiber.  Had childhood cancer that was treated with abdominal radiation. What are the signs or symptoms? Most polyps do not cause symptoms. If you have symptoms, they may include:  Blood coming from your rectum when having a bowel movement.  Blood in your stool. The stool may look dark red or black.  Abdominal pain.  A change in bowel habits, such as constipation or diarrhea. How is this diagnosed? This condition is diagnosed with a colonoscopy. This is a procedure in which a lighted, flexible scope is  inserted into the anus and then passed into the colon to examine the area. Polyps are sometimes found when a colonoscopy is done as part of routine cancer screening tests. How is this treated? Treatment for this condition involves  removing any polyps that are found. Most polyps can be removed during a colonoscopy. Those polyps will then be tested for cancer. Additional treatment may be needed depending on the results of testing. Follow these instructions at home: Lifestyle  Maintain a healthy weight, or lose weight if recommended by your health care provider.  Exercise every day or as told by your health care provider.  Do not use any products that contain nicotine or tobacco, such as cigarettes and e-cigarettes. If you need help quitting, ask your health care provider.  If you drink alcohol, limit how much you have: ? 0-1 drink a day for women. ? 0-2 drinks a day for men.  Be aware of how much alcohol is in your drink. In the [place] one drink equals one 12 oz bottle of beer (355 mL), one 5 oz glass of wine (148 mL), or one 1 oz shot of hard liquor (44 mL). Eating and drinking   Eat foods that are high in fiber, such as fruits, vegetables, and whole grains.  Eat foods that are high in calcium and vitamin D, such as milk, cheese, yogurt, eggs, liver, fish, and broccoli.  Limit foods that are high in fat, such as fried foods and desserts.  Limit the amount of red meat and processed meat you eat, such as hot dogs, sausage, bacon, and lunch meats. General instructions  Keep all follow-up visits as told by your health care provider. This is important. ? This includes having regularly scheduled colonoscopies. ? Talk to your health care provider about when you need a colonoscopy. Contact a health care provider if:  You have new or worsening bleeding during a bowel movement.  You have new or increased blood in your stool.  You have a change in bowel habits.  You lose weight for no known reason. Summary  Polyps are tissue growths inside the body. Polyps can grow in many places, including the colon.  Most colon polyps are noncancerous (benign), but some can become cancerous over time.  This condition is  diagnosed with a colonoscopy.  Treatment for this condition involves removing any polyps that are found. Most polyps can be removed during a colonoscopy. This information is not intended to replace advice given to you by your health care provider. Make sure you discuss any questions you have with your health care provider. Document Revised: 08/16/2017 Document Reviewed: 08/16/2017 Elsevier Patient Education  [place].

## 2019-07-24 NOTE — Anesthesia Procedure Notes (Addendum)
Procedure Name: Intubation Date/Time: 07/24/2019 10:57 AM Performed by: Victoriano Lain, CRNA Pre-anesthesia Checklist: Patient identified, Emergency Drugs available, Suction available and Patient being monitored Patient Re-evaluated:Patient Re-evaluated prior to induction Oxygen Delivery Method: Circle system utilized Preoxygenation: Pre-oxygenation with 100% oxygen Induction Type: IV induction Ventilation: Oral airway inserted - appropriate to patient size and Mask ventilation without difficulty Laryngoscope Size: Mac and 3 Grade View: Grade I Tube type: Oral Tube size: 7.5 mm Number of attempts: 1 Airway Equipment and Method: Stylet and Oral airway Placement Confirmation: ETT inserted through vocal cords under direct vision,  positive ETCO2 and breath sounds checked- equal and bilateral Secured at: 23 cm Tube secured with: Tape Dental Injury: Teeth and Oropharynx as per pre-operative assessment  Comments: Patient with lower 2 lower lip scabs noted in pre-op, one midline and the other on the right side. Intubation by Simona Huh, SRNA

## 2019-07-24 NOTE — Anesthesia Postprocedure Evaluation (Signed)
Anesthesia Post Note  Patient: Suheyb Verhagen  Procedure(s) Performed: PARTIAL PROCTECTOMY BY TEMOF DISTAL RECTAL MASS (N/A )     Patient location during evaluation: PACU Anesthesia Type: General Level of consciousness: awake and alert Pain management: pain level controlled Vital Signs Assessment: post-procedure vital signs reviewed and stable Respiratory status: spontaneous breathing, nonlabored ventilation and respiratory function stable Cardiovascular status: blood pressure returned to baseline and stable Postop Assessment: no apparent nausea or vomiting Anesthetic complications: no    Last Vitals:  Vitals:   07/24/19 1330 07/24/19 1336  BP: (!) 100/58 114/63  Pulse: 62 60  Resp: 14 16  Temp:  36.5 C  SpO2: 93% 95%    Last Pain:  Vitals:   07/24/19 1336  TempSrc:   PainSc: 0-No pain                 Lidia Collum

## 2019-07-24 NOTE — Anesthesia Preprocedure Evaluation (Signed)
Anesthesia Evaluation  Patient identified by MRN, date of birth, ID band Patient awake    Reviewed: Allergy & Precautions, NPO status , Patient's Chart, lab work & pertinent test results, reviewed documented beta blocker date and time   History of Anesthesia Complications Negative for: history of anesthetic complications  Airway Mallampati: II  TM Distance: >3 FB Neck ROM: Full    Dental  (+) Missing, Poor Dentition, Dental Advisory Given   Pulmonary neg pulmonary ROS,    Pulmonary exam normal        Cardiovascular hypertension, Pt. on medications and Pt. on home beta blockers Normal cardiovascular exam     Neuro/Psych negative neurological ROS  negative psych ROS   GI/Hepatic negative GI ROS, Neg liver ROS,   Endo/Other  Morbid obesity  Renal/GU negative Renal ROS  negative genitourinary   Musculoskeletal negative musculoskeletal ROS (+)   Abdominal   Peds  Hematology negative hematology ROS (+)   Anesthesia Other Findings   Reproductive/Obstetrics                             Anesthesia Physical Anesthesia Plan  ASA: III  Anesthesia Plan: General   Post-op Pain Management:    Induction: Intravenous  PONV Risk Score and Plan: Ondansetron, Dexamethasone, Treatment may vary due to age or medical condition and Midazolam  Airway Management Planned: Oral ETT  Additional Equipment: None  Intra-op Plan:   Post-operative Plan: Extubation in OR  Informed Consent: I have reviewed the patients History and Physical, chart, labs and discussed the procedure including the risks, benefits and alternatives for the proposed anesthesia with the patient or authorized representative who has indicated his/her understanding and acceptance.     Dental advisory given  Plan Discussed with:   Anesthesia Plan Comments:         Anesthesia Quick Evaluation
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
## 2019-07-24 NOTE — Progress Notes (Signed)
Pt updated about delay in OR procedure. Understanding.

## 2019-07-24 NOTE — Transfer of Care (Signed)
Immediate Anesthesia Transfer of Care Note  Patient: Mark [name]  Procedure(s) Performed: PARTIAL PROCTECTOMY BY TEMOF DISTAL RECTAL MASS (N/A )  Patient Location: PACU  Anesthesia Type:General  Level of Consciousness: awake, alert , oriented and patient cooperative  Airway & Oxygen Therapy: Patient Spontanous Breathing and Patient connected to face mask oxygen  Post-op Assessment: Report given to RN, Post -op Vital signs reviewed and stable and Patient moving all extremities  Post vital signs: Reviewed and stable  Last Vitals:  Vitals Value Taken Time  BP 115/59 07/24/19 1256  Temp    Pulse 65 07/24/19 1258  Resp 13 07/24/19 1258  SpO2 99 % 07/24/19 1258  Vitals shown include unvalidated device data.  Last Pain:  Vitals:   07/24/19 0811  TempSrc: Oral         Complications: No apparent anesthesia complications
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
## 2019-07-24 NOTE — Op Note (Signed)
07/24/2019  12:45 PM  PATIENT:  Mark [name]  64 y.o. male  Patient Care Team: Donald Prose, MD as PCP - General (Family Medicine) Michael Boston, MD as Consulting Physician (General Surgery) Arta Silence, MD as Consulting Physician (Gastroenterology)  PRE-OPERATIVE DIAGNOSIS:  PROLAPSING AND BLEEDING SERRATED RECTAL POLYP  POST-OPERATIVE DIAGNOSIS:  PROLAPSING AND BLEEDING SERRATED RECTAL POLYP  PROCEDURE:  PARTIAL PROCTECTOMY BY TEM OF DISTAL RECTAL MASS  SURGEON:  [name], MD  ASSISTANT: OR Staff   ANESTHESIA:   local and general  EBL:  Total I/O In: 1100 [I.V.:1000; IV Piggyback:100] Out: 205 [Blood:205]  Delay start of Pharmacological VTE agent (>24hrs) due to surgical blood loss or risk of bleeding:  no  DRAINS: none   SPECIMEN: Rectal polyp (see below)  DISPOSITION OF SPECIMEN:  PATHOLOGY  COUNTS:  YES  PLAN OF CARE: Admit for overnight observation  PATIENT DISPOSITION:  PACU - hemodynamically stable.  INDICATION: Mark [name] patient found to have large pedunculated mass in distal rectum.  Biopsy consistent with sessile serrated adenoma.  Felt to be too large to remove endoscopically.  Surgical consultation requested.  I referred removal of the rectal mass with partial proctectomy using a TEM  The anatomy & physiology of the digestive tract was discussed.  The pathophysiology of the rectal pathology was discussed.  Natural history risks without surgery was discussed.   I feel the risks of no intervention will lead to serious problems that outweigh the operative risks; therefore, I recommended surgery.    Laparoscopic & open abdominal techniques were discussed.  I recommended we start with a partial proctectomy by transanal endoscopic microsurgery (TEM) for excisional biopsy to remove the pathology and hopefully cure and/or control the pathology.  This technique can offer less operative risk and faster post-operative recovery.  Possible need for immediate or  later abdominal surgery for further treatment was discussed.   Risks such as bleeding, abscess, reoperation, ostomy, heart attack, death, and other risks were discussed.   I noted a good likelihood this will help address the problem.  Goals of post-operative recovery were discussed as well.  We will work to minimize complications.  An educational handout was given as well.  Questions were answered.  The patient expresses understanding & wishes to proceed with surgery.  OR FINDINGS: Pedunculated distal rectal polypoid mass consistent with the serrated adenomatous polyp.  4 x 3 x 2 cm.  Somewhat narrow base.  Right anterior lateral distal margin 2 cm from the anal verge.  Distal margin somewhat close and anal canal.  Therefore additional 40x38mm distal margin sent  The resting wound was 6 x 5 cm  Pin placement on main pathology specimen: Proximal margin: Red Distal margin: Green Right anterior: Yellow Right posterior:  White   Additional distal margin Distal margin: Green Right anterior: Yellow Right posterior:  White   The closure rests 0cm from the anal verge in the right anterolateral location.  It is 45% of the circumference.  DESCRIPTION: Informed consent was confirmed.  Patient received general anesthesia without difficulty.  Foley catheter sterilely placed.  Sequential compression devices active during the entire case.  The patient was placed in the prone position, taking extra care to secure and protect the patient appropriately.  The perineum and perianal regions were prepped and draped in a sterile fashion.  Surgical timeout confirmed our plan.  I did a gentle digital rectal examination with gradual anal dilation to allow placement of the 4 cm TEO Stortz scope.  This was  secured to the bed using the TEO clamping system.  We induced carbon dioxide insufflation intraluminally.  I oriented the Stortz scope and the patient such that the specimen rested towards the floor at the 6:00  position.  I could easily identify the mass.  I went ahead and used tip point cautery to mark 1 cm margins circumferentially.  I then did a full thickness transection at the distal margin.  I came around laterally.  Switched over to harmonic dissection.  Lifted the specimen off the pelvic canal for a good deep margin.  I gradually came more proximally.  Transected at the proximal margin.  I ensured hemostasis.  I inspected the main specimen and pinned it on thick cork board.  Pins as noted above.  The distal margin was <1cm, so I excised a transverse distal margin arc of the right anterior and lateral anal canal.   I walked the main specimen down to pathology and showed the Mark [name] pathology team for proper orientation.    I went back in and scrubbed in.  Hemostasis was good.  I reapproximated the wound with a 2-0 running Vicryl suture starting from the anterior corner of the posterior corner and meeting in the middle operation large Sawyer anal retractor..  I ended up closing the center part somewhat vertically the distal end such that the wound was somewhat of a "Y".  This brought things together well.  I did meticulous inspection with fine tip instruments to confirm good watertight closure   Hemostasis excellent.  The lumen was quite patent.  Carbon dioxide evacuated & instruments removed.  The patient is being extubated go to recovery room.  I discussed operative findings, updated the patient's status, discussed probable steps to recovery, and gave postoperative recommendations to the patient's daughter, Mark [name].  Recommendations were made.  Questions were answered.  She expressed understanding & appreciation.  Because his polyp was extremely distal like a hemorrhoidectomy, I think it is safe for him to go home.  Anesthesia agrees.  We will monitor in the PACU make sure he meets discharge goals.   [name], MD, FACS, MASCRS Gastrointestinal and Minimally Invasive Surgery    [address], [place], [place] [address] [phone] Main / Paging [phone] Fax

## 2019-07-25 NOTE — Addendum Note (Signed)
Addendum  created 07/25/19 [id number] by Dereonna Lensing, Sanda Klein, RN   Flowsheet accepted, Intraprocedure Flowsheets edited

## 2019-07-28 LAB — SURGICAL PATHOLOGY

## 2019-12-24 IMAGING — US ULTRASOUND ABDOMEN LIMITED
1 series · 6 of 6 positions shown · non-contrast
Comparison: No prior.

CLINICAL DATA: Abdominal mass left of umbilicus x2 weeks.

EXAM:
ULTRASOUND ABDOMEN LIMITED

[Series 1: ultrasound abdomen limited · 0.10mm/px · 6 of 6 slices shown]
[im 1/6]
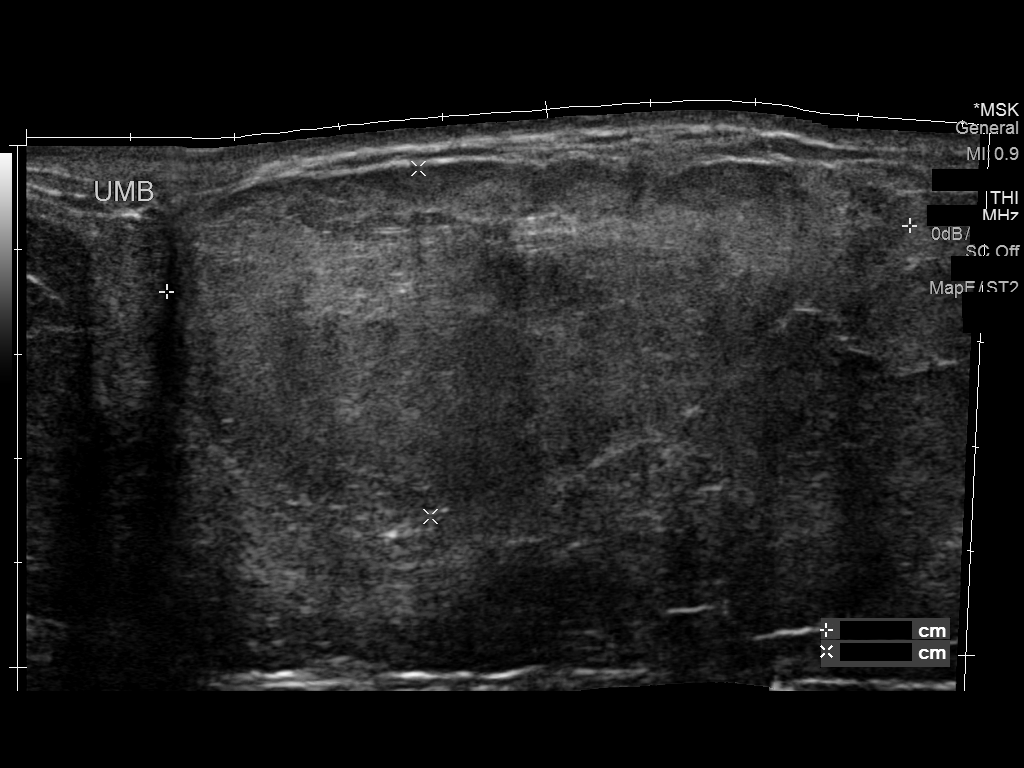
[im 2/6]
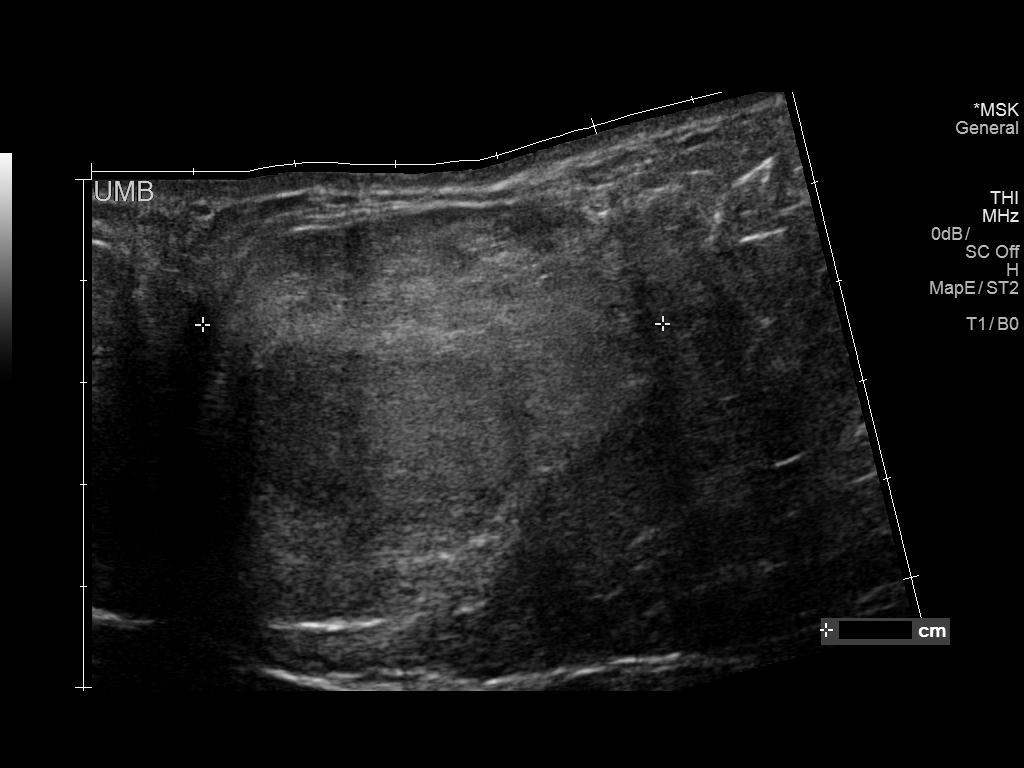
[im 3/6]
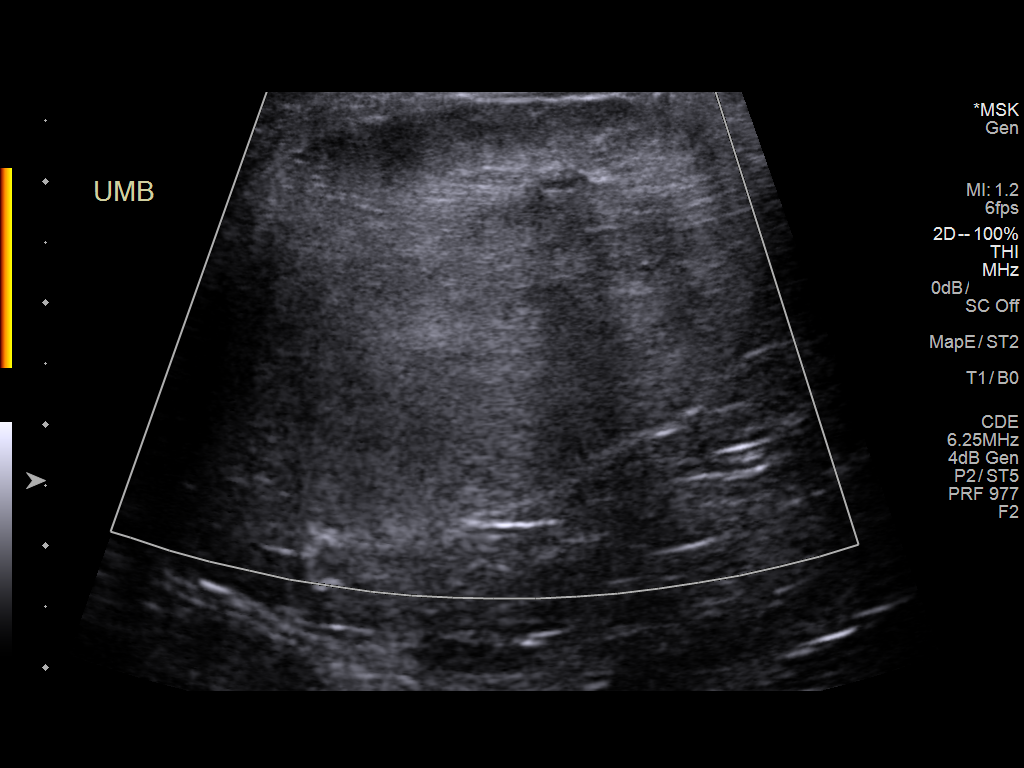
[im 4/6]
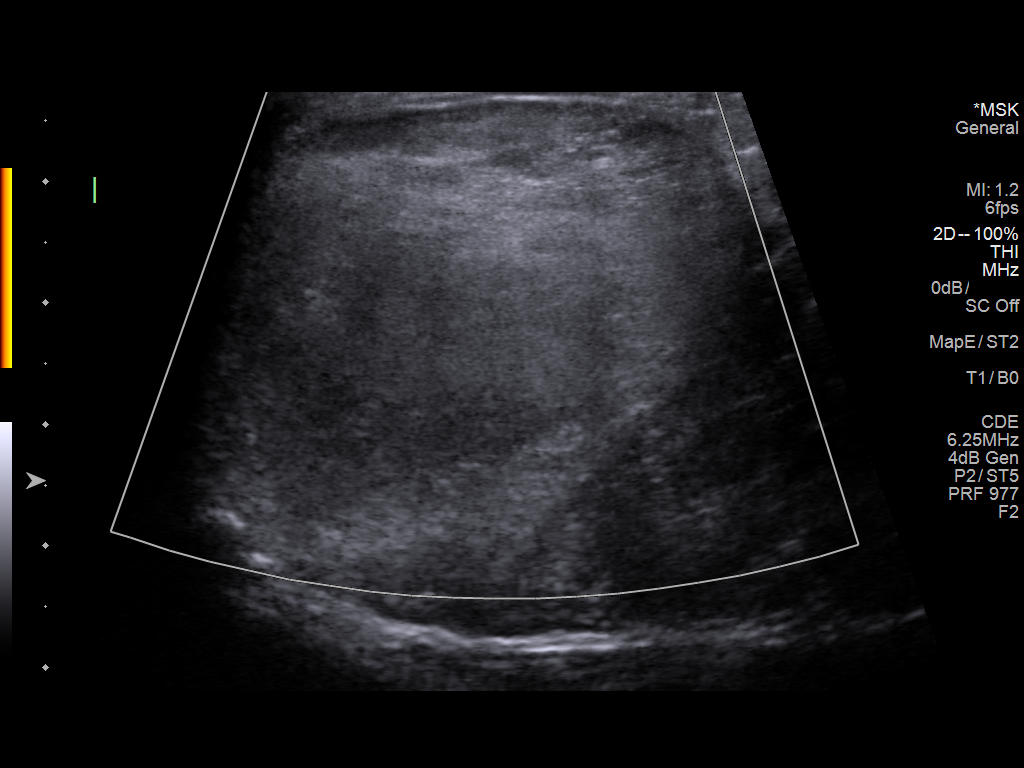
[im 5/6]
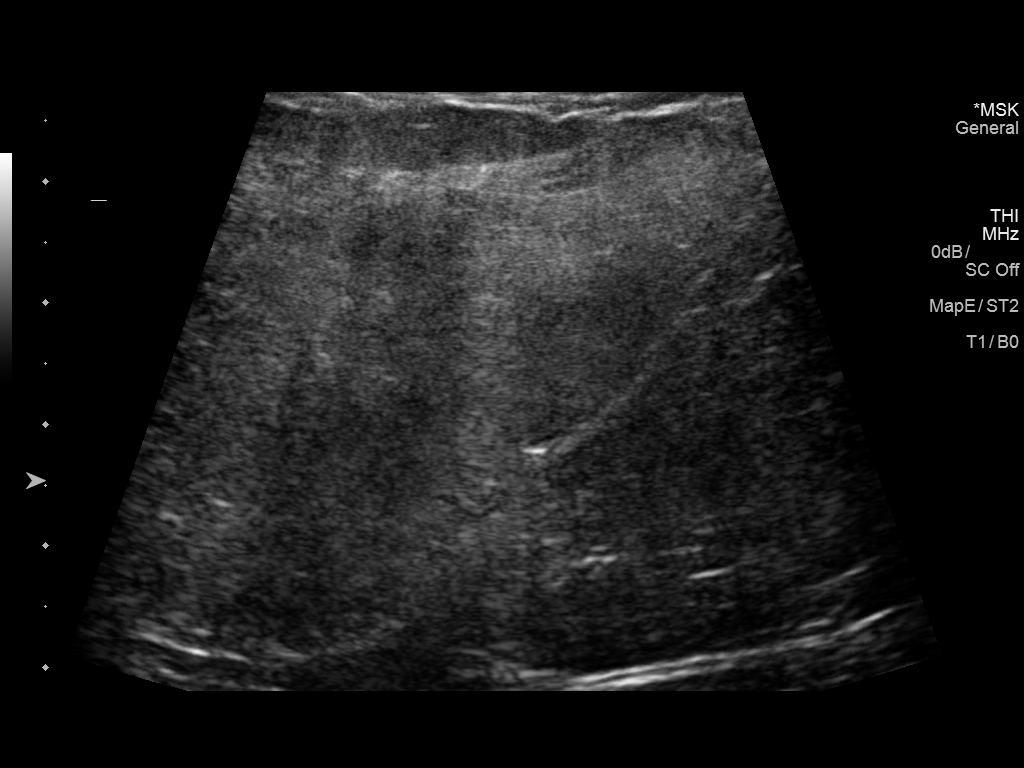
[im 6/6]
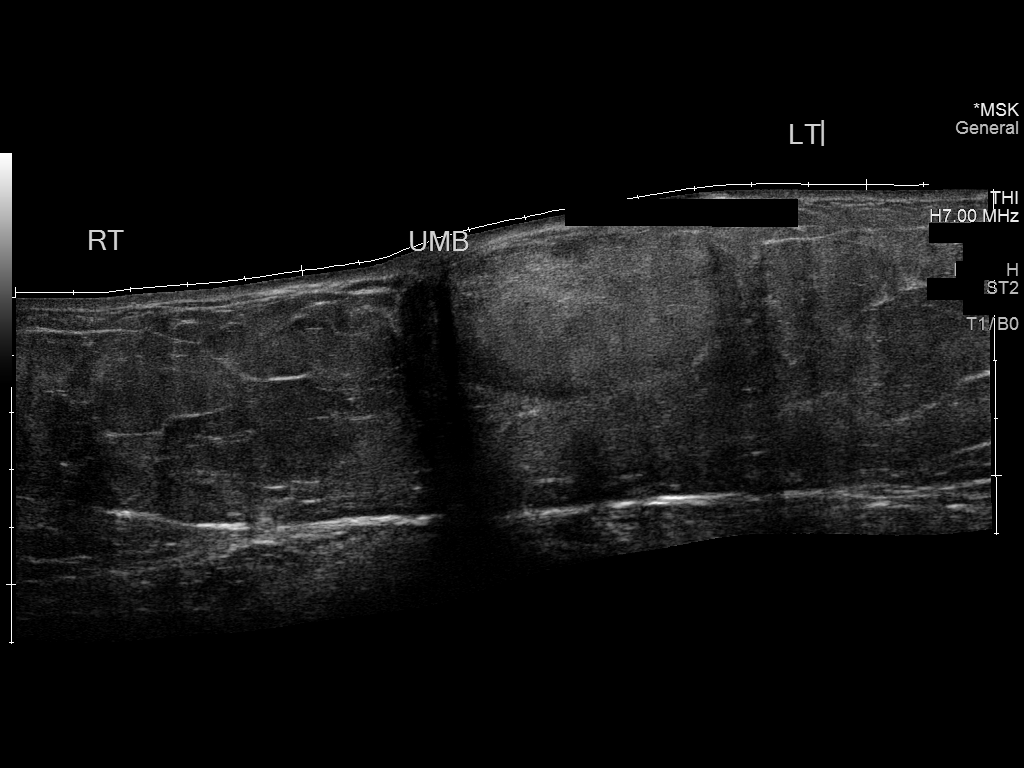

[6 of 6 positions shown; findings below may reference images not displayed]

FINDINGS: Ultrasound of the palpable periumbilical mass reveals a solid
appearing mass measuring 7.2 x 3.3 x 4.5 cm. The mass is
heterogeneous. No abnormal fluid collection noted. Differential
diagnosis includes a tumor such as a lipoma. Hernia can not be
completely excluded. Nonenhanced CT of the abdomen and pelvis
suggested for further evaluation.
IMPRESSION: Solid-appearing mass noted the region of clinical concern measuring
7.2 x 3.3 x 4.5 cm. Differential diagnosis includes a tumor such as
a lipoma. An abdominal hernia can not be excluded. Nonenhanced CT of
the abdomen and pelvis suggested for further evaluation.

## 2021-08-31 DIAGNOSIS — I10 Essential (primary) hypertension: Secondary | ICD-10-CM | POA: Diagnosis not present

## 2021-08-31 DIAGNOSIS — E039 Hypothyroidism, unspecified: Secondary | ICD-10-CM | POA: Diagnosis not present

## 2021-08-31 DIAGNOSIS — E1169 Type 2 diabetes mellitus with other specified complication: Secondary | ICD-10-CM | POA: Diagnosis not present

## 2021-08-31 DIAGNOSIS — E7800 Pure hypercholesterolemia, unspecified: Secondary | ICD-10-CM | POA: Diagnosis not present

## 2021-10-21 DIAGNOSIS — R7989 Other specified abnormal findings of blood chemistry: Secondary | ICD-10-CM | POA: Diagnosis not present

## 2021-10-21 DIAGNOSIS — R946 Abnormal results of thyroid function studies: Secondary | ICD-10-CM | POA: Diagnosis not present

## 2022-01-17 DIAGNOSIS — L918 Other hypertrophic disorders of the skin: Secondary | ICD-10-CM | POA: Diagnosis not present

## 2022-02-08 DIAGNOSIS — L918 Other hypertrophic disorders of the skin: Secondary | ICD-10-CM | POA: Diagnosis not present

## 2022-03-15 DIAGNOSIS — E1169 Type 2 diabetes mellitus with other specified complication: Secondary | ICD-10-CM | POA: Diagnosis not present

## 2022-03-15 DIAGNOSIS — E039 Hypothyroidism, unspecified: Secondary | ICD-10-CM | POA: Diagnosis not present

## 2022-03-15 DIAGNOSIS — M109 Gout, unspecified: Secondary | ICD-10-CM | POA: Diagnosis not present

## 2022-03-15 DIAGNOSIS — E7800 Pure hypercholesterolemia, unspecified: Secondary | ICD-10-CM | POA: Diagnosis not present

## 2022-03-15 DIAGNOSIS — I10 Essential (primary) hypertension: Secondary | ICD-10-CM | POA: Diagnosis not present

## 2022-03-15 DIAGNOSIS — Z0000 Encounter for general adult medical examination without abnormal findings: Secondary | ICD-10-CM | POA: Diagnosis not present

## 2022-03-15 DIAGNOSIS — I700 Atherosclerosis of aorta: Secondary | ICD-10-CM | POA: Diagnosis not present

## 2022-03-15 DIAGNOSIS — R221 Localized swelling, mass and lump, neck: Secondary | ICD-10-CM | POA: Diagnosis not present

## 2022-03-29 DIAGNOSIS — R221 Localized swelling, mass and lump, neck: Secondary | ICD-10-CM | POA: Diagnosis not present

## 2022-04-04 DIAGNOSIS — D170 Benign lipomatous neoplasm of skin and subcutaneous tissue of head, face and neck: Secondary | ICD-10-CM | POA: Diagnosis not present

## 2022-09-13 DIAGNOSIS — E7800 Pure hypercholesterolemia, unspecified: Secondary | ICD-10-CM | POA: Diagnosis not present

## 2022-09-13 DIAGNOSIS — M109 Gout, unspecified: Secondary | ICD-10-CM | POA: Diagnosis not present

## 2022-09-13 DIAGNOSIS — E039 Hypothyroidism, unspecified: Secondary | ICD-10-CM | POA: Diagnosis not present

## 2022-09-13 DIAGNOSIS — E1165 Type 2 diabetes mellitus with hyperglycemia: Secondary | ICD-10-CM | POA: Diagnosis not present

## 2022-09-13 DIAGNOSIS — I10 Essential (primary) hypertension: Secondary | ICD-10-CM | POA: Diagnosis not present

## 2022-09-13 DIAGNOSIS — E1159 Type 2 diabetes mellitus with other circulatory complications: Secondary | ICD-10-CM | POA: Diagnosis not present

## 2022-09-13 DIAGNOSIS — I700 Atherosclerosis of aorta: Secondary | ICD-10-CM | POA: Diagnosis not present

## 2022-09-13 DIAGNOSIS — M79671 Pain in right foot: Secondary | ICD-10-CM | POA: Diagnosis not present

## 2022-09-13 DIAGNOSIS — Z6841 Body Mass Index (BMI) 40.0 and over, adult: Secondary | ICD-10-CM | POA: Diagnosis not present

## 2023-04-03 DIAGNOSIS — E7800 Pure hypercholesterolemia, unspecified: Secondary | ICD-10-CM | POA: Diagnosis not present

## 2023-04-03 DIAGNOSIS — Z1159 Encounter for screening for other viral diseases: Secondary | ICD-10-CM | POA: Diagnosis not present

## 2023-04-03 DIAGNOSIS — I700 Atherosclerosis of aorta: Secondary | ICD-10-CM | POA: Diagnosis not present

## 2023-04-03 DIAGNOSIS — E1159 Type 2 diabetes mellitus with other circulatory complications: Secondary | ICD-10-CM | POA: Diagnosis not present

## 2023-04-03 DIAGNOSIS — E039 Hypothyroidism, unspecified: Secondary | ICD-10-CM | POA: Diagnosis not present

## 2023-04-03 DIAGNOSIS — Z125 Encounter for screening for malignant neoplasm of prostate: Secondary | ICD-10-CM | POA: Diagnosis not present

## 2023-04-03 DIAGNOSIS — I10 Essential (primary) hypertension: Secondary | ICD-10-CM | POA: Diagnosis not present

## 2023-04-03 DIAGNOSIS — M79662 Pain in left lower leg: Secondary | ICD-10-CM | POA: Diagnosis not present

## 2023-04-03 DIAGNOSIS — M109 Gout, unspecified: Secondary | ICD-10-CM | POA: Diagnosis not present

## 2023-04-03 DIAGNOSIS — Z0000 Encounter for general adult medical examination without abnormal findings: Secondary | ICD-10-CM | POA: Diagnosis not present

## 2023-04-03 DIAGNOSIS — E119 Type 2 diabetes mellitus without complications: Secondary | ICD-10-CM | POA: Diagnosis not present

## 2023-09-25 DIAGNOSIS — R252 Cramp and spasm: Secondary | ICD-10-CM | POA: Diagnosis not present

## 2023-10-05 DIAGNOSIS — M79671 Pain in right foot: Secondary | ICD-10-CM | POA: Diagnosis not present

## 2023-10-05 DIAGNOSIS — E039 Hypothyroidism, unspecified: Secondary | ICD-10-CM | POA: Diagnosis not present

## 2023-10-05 DIAGNOSIS — M109 Gout, unspecified: Secondary | ICD-10-CM | POA: Diagnosis not present

## 2023-10-05 DIAGNOSIS — E7800 Pure hypercholesterolemia, unspecified: Secondary | ICD-10-CM | POA: Diagnosis not present

## 2023-10-05 DIAGNOSIS — R252 Cramp and spasm: Secondary | ICD-10-CM | POA: Diagnosis not present

## 2023-10-05 DIAGNOSIS — E1159 Type 2 diabetes mellitus with other circulatory complications: Secondary | ICD-10-CM | POA: Diagnosis not present

## 2023-10-05 DIAGNOSIS — I10 Essential (primary) hypertension: Secondary | ICD-10-CM | POA: Diagnosis not present

## 2023-12-19 DIAGNOSIS — H52203 Unspecified astigmatism, bilateral: Secondary | ICD-10-CM | POA: Diagnosis not present

## 2023-12-19 DIAGNOSIS — H2513 Age-related nuclear cataract, bilateral: Secondary | ICD-10-CM | POA: Diagnosis not present

## 2023-12-19 DIAGNOSIS — E119 Type 2 diabetes mellitus without complications: Secondary | ICD-10-CM | POA: Diagnosis not present
